# Patient Record
Sex: Female | Born: 1995
Health system: Southern US, Community
[De-identification: ages and names within clinical notes are randomized; demographics above are authoritative.]

---
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
## 2019-07-16 ENCOUNTER — Other Ambulatory Visit: Payer: Self-pay

## 2019-07-16 DIAGNOSIS — Z20822 Contact with and (suspected) exposure to covid-19: Secondary | ICD-10-CM

## 2019-07-18 LAB — NOVEL CORONAVIRUS, NAA: SARS-CoV-2, NAA: NOT DETECTED

## 2021-07-22 ENCOUNTER — Encounter (HOSPITAL_COMMUNITY): Payer: Self-pay | Admitting: *Deleted

## 2021-07-22 ENCOUNTER — Ambulatory Visit (INDEPENDENT_AMBULATORY_CARE_PROVIDER_SITE_OTHER): Payer: Self-pay

## 2021-07-22 ENCOUNTER — Ambulatory Visit (HOSPITAL_COMMUNITY)
Admission: EM | Admit: 2021-07-22 | Discharge: 2021-07-22 | Disposition: A | Discharge status: Home / Self Care | Payer: Self-pay | Attending: Student | Admitting: Student

## 2021-07-22 ENCOUNTER — Other Ambulatory Visit: Payer: Self-pay

## 2021-07-22 DIAGNOSIS — Z113 Encounter for screening for infections with a predominantly sexual mode of transmission: Secondary | ICD-10-CM | POA: Insufficient documentation

## 2021-07-22 DIAGNOSIS — J069 Acute upper respiratory infection, unspecified: Secondary | ICD-10-CM | POA: Insufficient documentation

## 2021-07-22 DIAGNOSIS — R059 Cough, unspecified: Secondary | ICD-10-CM

## 2021-07-22 LAB — HIV ANTIBODY (ROUTINE TESTING W REFLEX): HIV Screen 4th Generation wRfx: NONREACTIVE

## 2021-07-22 MED ORDER — ALBUTEROL SULFATE HFA 108 (90 BASE) MCG/ACT IN AERS: 1.0000 | INHALATION_SPRAY | Freq: Four times a day (QID) | RESPIRATORY_TRACT | 0 refills | Status: AC | PRN

## 2021-07-22 MED ORDER — PROMETHAZINE-DM 6.25-15 MG/5ML PO SYRP: 5.0000 mL | ORAL_SOLUTION | Freq: Four times a day (QID) | ORAL | 0 refills | Status: AC | PRN

## 2021-07-22 NOTE — ED Provider Notes (Signed)
[place]    CSN: [id number] Arrival date & time: 07/22/21  [place]      History   Chief Complaint Chief Complaint  Patient presents with   Cough    HPI Rhonda Pollard is a 26 y.o. female presenting with cough.  Medical history noncontributory, denies history of cardiopulmonary disease or asthma.  States she initially got sick after traveling for Christmas, she felt better for about a week, and is now sick again for 3 days following another plane flight.  Describes nonproductive cough, without shortness of breath, chest pain, dizziness, fever/chills, weakness, myalgias, nausea/vomiting/diarrhea. States she is not pregnant or breastfeeding.  Has attempted over-the-counter medications with minimal relief.  Also states that she is requesting a full STI panel, new female partner but denies symptoms.  HPI  History reviewed. No pertinent past medical history.  There are no problems to display for this patient.   History reviewed. No pertinent surgical history.  OB History   No obstetric history on file.      Home Medications    Prior to Admission medications   Medication Sig Start Date End Date Taking? Authorizing Provider  albuterol (VENTOLIN HFA) 108 (90 Base) MCG/ACT inhaler Inhale 1-2 puffs into the lungs every 6 (six) hours as needed for wheezing or shortness of breath. 07/22/21  Yes Hazel Sams, PA-C  promethazine-dextromethorphan (PROMETHAZINE-DM) 6.25-15 MG/5ML syrup Take 5 mLs by mouth 4 (four) times daily as needed for cough. 07/22/21  Yes Hazel Sams, PA-C    Family History History reviewed. No pertinent family history.  Social History Social History   Tobacco Use   Smoking status: Never   Smokeless tobacco: Never     Allergies   Other   Review of Systems Review of Systems  Constitutional:  Negative for appetite change, chills and fever.  HENT:  Negative for congestion, ear pain, rhinorrhea, sinus pressure, sinus pain and sore throat.    Eyes:  Negative for redness and visual disturbance.  Respiratory:  Positive for cough. Negative for chest tightness, shortness of breath and wheezing.   Cardiovascular:  Negative for chest pain and palpitations.  Gastrointestinal:  Negative for abdominal pain, constipation, diarrhea, nausea and vomiting.  Genitourinary:  Negative for decreased urine volume, dysuria, frequency, urgency, vaginal bleeding, vaginal discharge and vaginal pain.  Musculoskeletal:  Negative for myalgias.  Neurological:  Negative for dizziness, weakness and headaches.  Psychiatric/Behavioral:  Negative for confusion.   All other systems reviewed and are negative.   Physical Exam Triage Vital Signs ED Triage Vitals  Enc Vitals Group     BP 07/22/21 1842 125/81     Pulse Rate 07/22/21 1842 78     Resp 07/22/21 1842 18     Temp 07/22/21 1842 99.7 F (37.6 C)     Temp src --      SpO2 07/22/21 1842 98 %     Weight --      Height --      Head Circumference --      Peak Flow --      Pain Score 07/22/21 1839 0     Pain Loc --      Pain Edu? --      Excl. in [place]? --    No data found.  Updated Vital Signs BP 125/81    Pulse 78    Temp 99.7 F (37.6 C)    Resp 18    SpO2 98%   Visual Acuity Right Eye Distance:  Left Eye Distance:   Bilateral Distance:    Right Eye Near:   Left Eye Near:    Bilateral Near:     Physical Exam Vitals reviewed.  Constitutional:      General: She is not in acute distress.    Appearance: Normal appearance. She is not ill-appearing.  HENT:     Head: Normocephalic and atraumatic.     Right Ear: Tympanic membrane, ear canal and external ear normal. No tenderness. No middle ear effusion. There is no impacted cerumen. Tympanic membrane is not perforated, erythematous, retracted or bulging.     Left Ear: Tympanic membrane, ear canal and external ear normal. No tenderness.  No middle ear effusion. There is no impacted cerumen. Tympanic membrane is not perforated, erythematous,  retracted or bulging.     Nose: Nose normal. No congestion.     Mouth/Throat:     Mouth: Mucous membranes are moist.     Pharynx: Uvula midline. No oropharyngeal exudate or posterior oropharyngeal erythema.  Eyes:     Extraocular Movements: Extraocular movements intact.     Pupils: Pupils are equal, round, and reactive to light.  Cardiovascular:     Rate and Rhythm: Normal rate and regular rhythm.     Heart sounds: Normal heart sounds.  Pulmonary:     Effort: Pulmonary effort is normal.     Breath sounds: Normal breath sounds. No decreased breath sounds, wheezing, rhonchi or rales.  Abdominal:     Palpations: Abdomen is soft.     Tenderness: There is no abdominal tenderness. There is no guarding or rebound.  Genitourinary:    Comments: deferred Lymphadenopathy:     Cervical: No cervical adenopathy.     Right cervical: No superficial cervical adenopathy.    Left cervical: No superficial cervical adenopathy.  Neurological:     General: No focal deficit present.     Mental Status: She is alert and oriented to person, place, and time.  Psychiatric:        Mood and Affect: Mood normal.        Behavior: Behavior normal.        Thought Content: Thought content normal.        Judgment: Judgment normal.     UC Treatments / Results  Labs (all labs ordered are listed, but only abnormal results are displayed) Labs Reviewed  HIV ANTIBODY (ROUTINE TESTING W REFLEX)  RPR  CERVICOVAGINAL ANCILLARY ONLY    EKG   Radiology DG Chest 2 View  Result Date: 07/22/2021 CLINICAL DATA:  Cough x1 month. EXAM: CHEST - 2 VIEW COMPARISON:  None. FINDINGS: Cardiomediastinal silhouette is within normal limits. Lungs are well inflated. No focal consolidation or mass. No pleural effusion or pneumothorax. No acute displaced fracture. IMPRESSION: Normal chest. Electronically Signed   By: Michaelle Birks M.D.   On: 07/22/2021 19:01    Procedures Procedures (including critical care time)  Medications  Ordered in UC Medications - No data to display  Initial Impression / Assessment and Plan / UC Course  I have reviewed the triage vital signs and the nursing notes.  Pertinent labs & imaging results that were available during my care of the patient were reviewed by me and considered in my medical decision making (see chart for details).     This patient is a very pleasant 26 y.o. year old female presenting with cough.  This appears to be 2 separate viral URIs.  Patient desires a chest x-ray to rule out pneumonia, CXR- no  acute cardiopulm ds.  She is afebrile and nontachycardic.  She prefers home COVID test as she wishes to minimize cost today given no insurance.  Albuterol inhaler and Promethazine DM sent.  She is also incidentally requesting full STI panel, denies symptoms but does have a new partner. Will send self-swab for G/C, trich, yeast, BV testing. Also sent HIV, RPR. Safe sex precautions.   ED return precautions discussed. Patient verbalizes understanding and agreement.    Final Clinical Impressions(s) / UC Diagnoses   Final diagnoses:  Viral URI with cough  Routine screening for STI (sexually transmitted infection)     Discharge Instructions      -Your chest xray looks good. I suspect you have a new virus from the recent travel. You can take an at-home covid test.  -Albuterol inhaler as needed for cough, wheezing, shortness of breath, 1 to 2 puffs every 6 hours as needed. -Promethazine DM cough syrup for congestion/cough. This could make you drowsy, so take at night before bed. -We have sent testing for sexually transmitted infections. We will notify you of any positive results once they are received. If required, we will prescribe any medications you might need. Please refrain from all sexual activity until treatment is complete.  -Seek additional medical attention if you develop fevers/chills, new/worsening abdominal pain, new/worsening vaginal discomfort/discharge, etc.        ED Prescriptions     Medication Sig Dispense Auth. Provider   albuterol (VENTOLIN HFA) 108 (90 Base) MCG/ACT inhaler Inhale 1-2 puffs into the lungs every 6 (six) hours as needed for wheezing or shortness of breath. 1 each Hazel Sams, PA-C   promethazine-dextromethorphan (PROMETHAZINE-DM) 6.25-15 MG/5ML syrup Take 5 mLs by mouth 4 (four) times daily as needed for cough. 118 mL Hazel Sams, PA-C      PDMP not reviewed this encounter.   Hazel Sams, PA-C 07/22/21 1920

## 2021-07-22 NOTE — Discharge Instructions (Addendum)
-  Your chest xray looks good. I suspect you have a new virus from the recent travel. You can take an at-home covid test.  -Albuterol inhaler as needed for cough, wheezing, shortness of breath, 1 to 2 puffs every 6 hours as needed. -Promethazine DM cough syrup for congestion/cough. This could make you drowsy, so take at night before bed. -We have sent testing for sexually transmitted infections. We will notify you of any positive results once they are received. If required, we will prescribe any medications you might need. Please refrain from all sexual activity until treatment is complete.  -Seek additional medical attention if you develop fevers/chills, new/worsening abdominal pain, new/worsening vaginal discomfort/discharge, etc.

## 2021-07-22 NOTE — ED Triage Notes (Signed)
Pt has been coughing since December. Pt was living in [place] and recently flew back to [place]. The cough has been on going.

## 2021-07-23 LAB — RPR: RPR Ser Ql: NONREACTIVE

## 2021-07-25 ENCOUNTER — Telehealth (HOSPITAL_COMMUNITY): Payer: Self-pay | Admitting: Emergency Medicine

## 2021-07-25 LAB — CERVICOVAGINAL ANCILLARY ONLY
Bacterial Vaginitis (gardnerella): POSITIVE — AB
Candida Glabrata: NEGATIVE
Candida Vaginitis: NEGATIVE
Chlamydia: NEGATIVE
Comment: NEGATIVE
Comment: NEGATIVE
Comment: NEGATIVE
Comment: NEGATIVE
Comment: NEGATIVE
Comment: NORMAL
Neisseria Gonorrhea: NEGATIVE
Trichomonas: NEGATIVE

## 2021-07-25 MED ORDER — METRONIDAZOLE 500 MG PO TABS: 500.0000 mg | ORAL_TABLET | Freq: Two times a day (BID) | ORAL | 0 refills | Status: AC

## 2021-08-15 ENCOUNTER — Encounter (HOSPITAL_COMMUNITY): Payer: Self-pay | Admitting: *Deleted

## 2022-12-20 IMAGING — DX DG CHEST 2V
2 series · 2 of 2 positions shown · non-contrast
Comparison: None.

CLINICAL DATA: Cough x1 month.

EXAM:
CHEST - 2 VIEW

[chest pa]
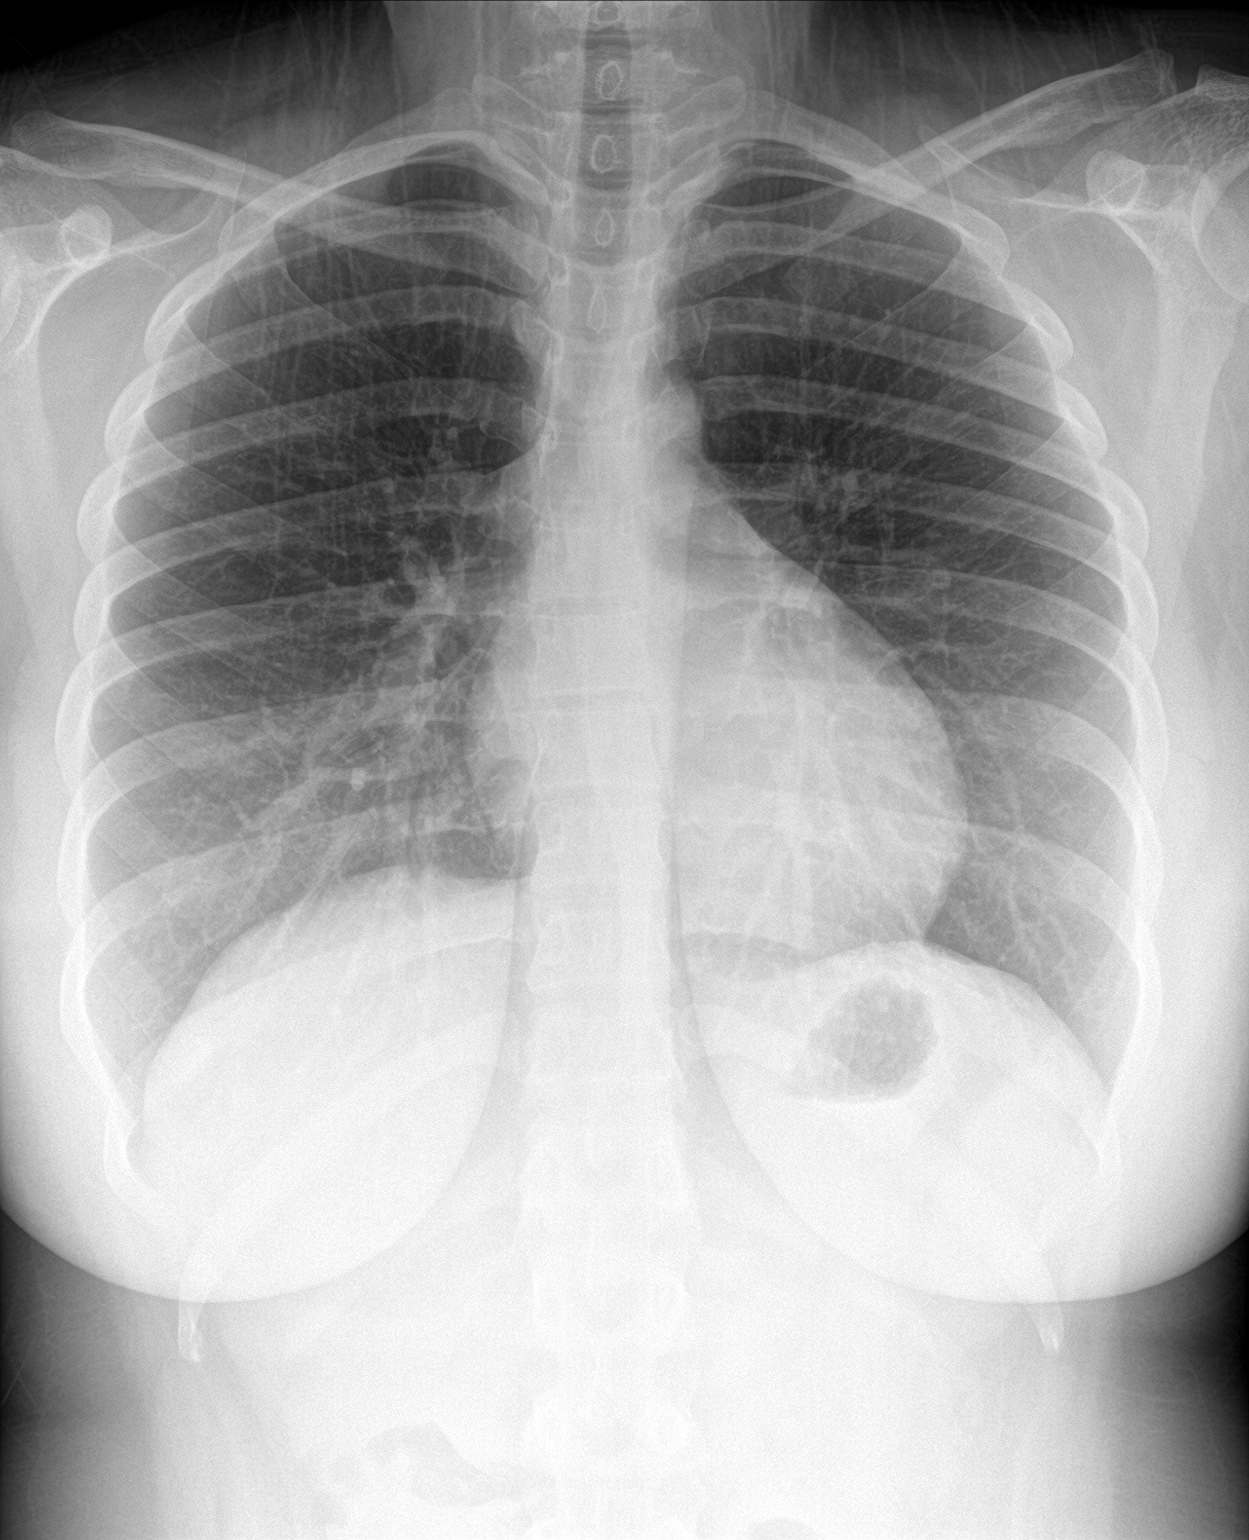

[chest lat]
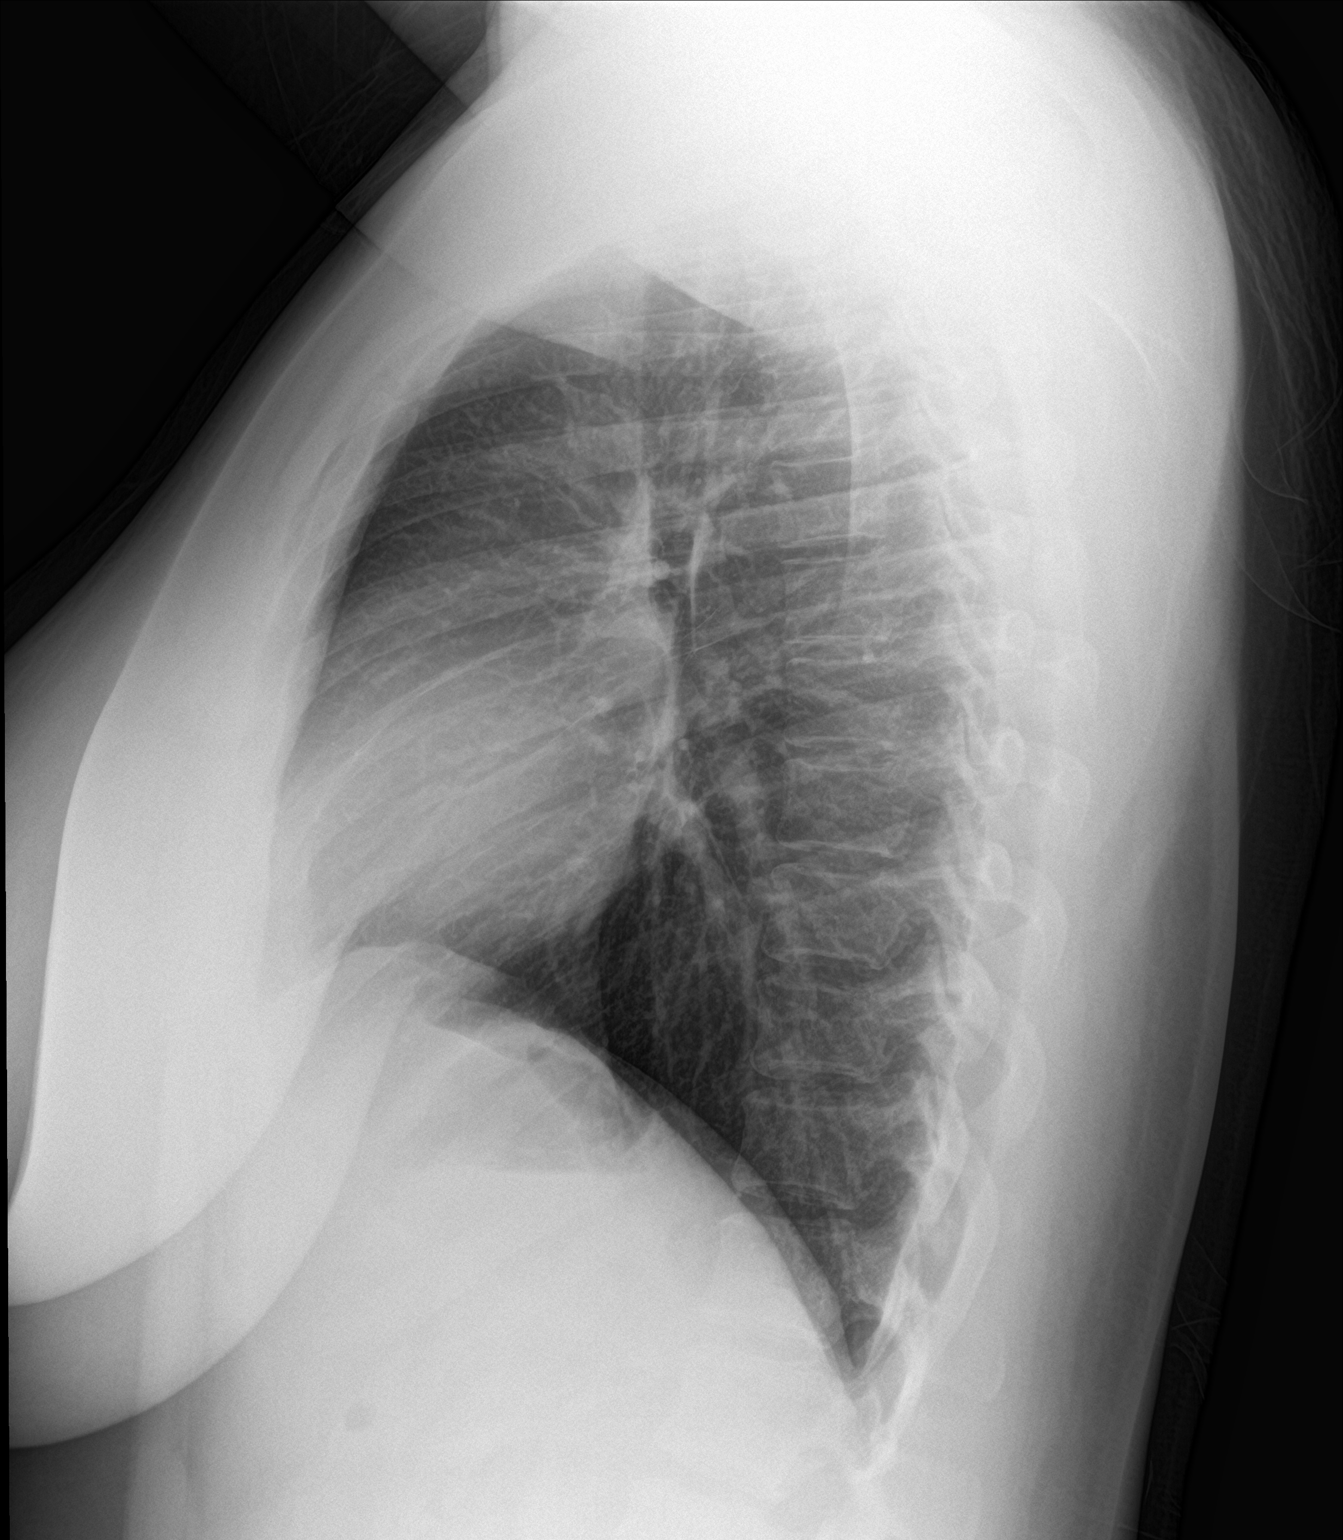

[2 of 2 positions shown; findings below may reference images not displayed]

FINDINGS: Cardiomediastinal silhouette is within normal limits. Lungs are well
inflated. No focal consolidation or mass. No pleural effusion or
pneumothorax. No acute displaced fracture.
IMPRESSION: Normal chest.
